# Patient Record
Sex: Male | Born: 1995 | Race: White | Hispanic: No | Marital: Single | Smoking: Never smoker
Health system: Southern US, Community
[De-identification: ages and names within clinical notes are randomized; demographics above are authoritative.]

## PROBLEM LIST (undated history)

## (undated) DIAGNOSIS — J45990 Exercise induced bronchospasm: Secondary | ICD-10-CM

---
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
## 2015-06-21 IMAGING — CT CT CERVICAL SPINE W/O CM
3 of 6 series · 11 of 33 positions shown, 13 images · non-contrast
Comparison: None.

CLINICAL DATA: Fell pole vaulting from 15 feet, no loss of
consciousness but had blurred vision and imbalance

EXAM:
CT HEAD WITHOUT CONTRAST
CT CERVICAL SPINE WITHOUT CONTRAST
TECHNIQUE: Multidetector CT imaging of the head and cervical spine was
performed following the standard protocol without intravenous
contrast. Multiplanar CT image reconstructions of the cervical spine
were also generated.

[Series 5: c_spine 2.0 i30s 3 · axial · 0.32mm/px · z∈[+1052,+1160]mm · 3 of 109 slices shown, 4 images]
[im 28/109  soft-tissue]
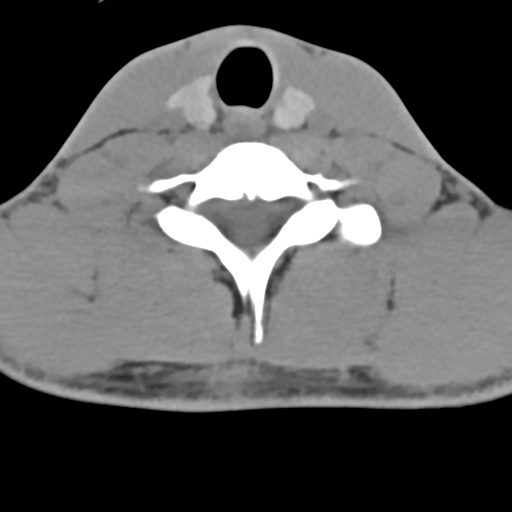
[im 28/109  bone]
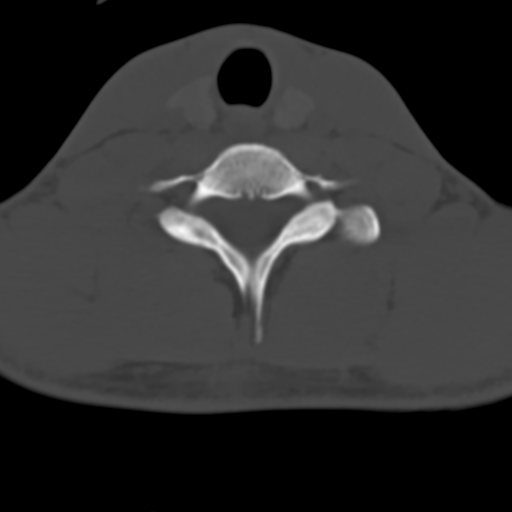
[im 55/109  bone]
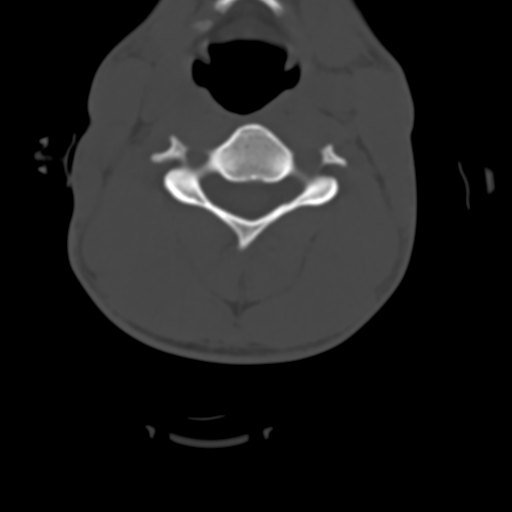
[im 82/109  bone]
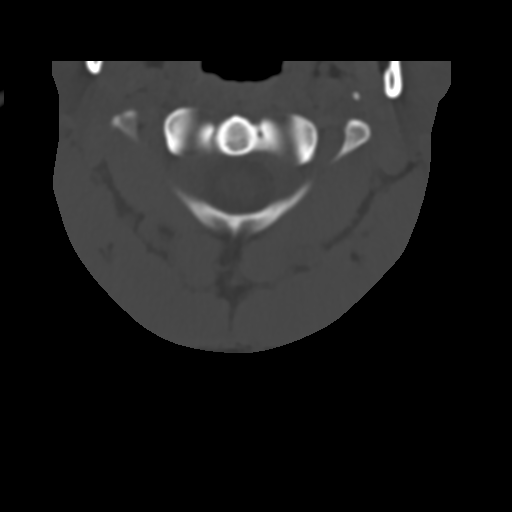

[Series 7: coronals · coronal · 0.23mm/px · 3 of 61 slices shown]
[im 13/61  bone]
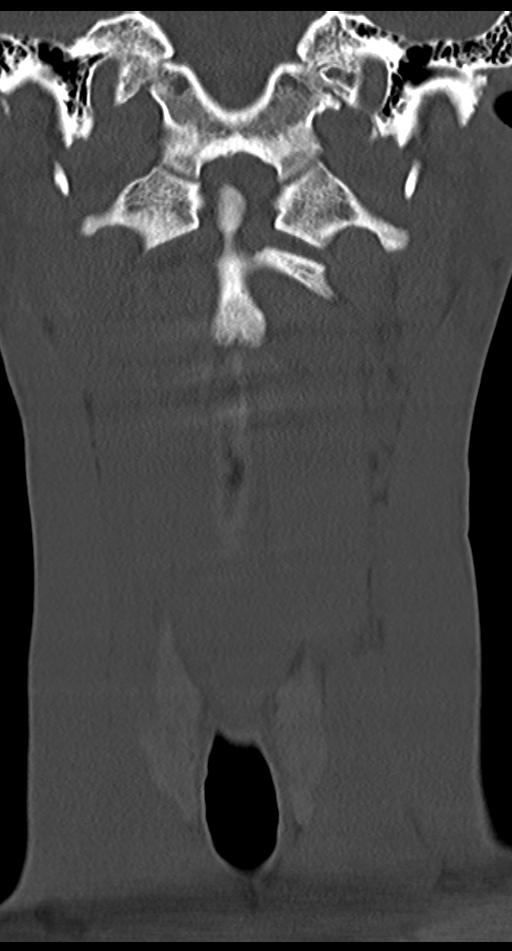
[im 25/61  bone]
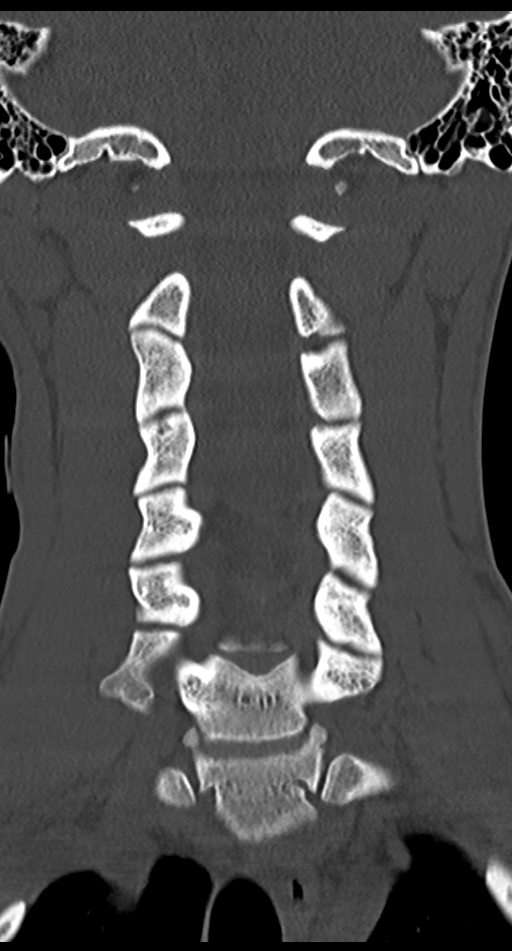
[im 37/61  bone]
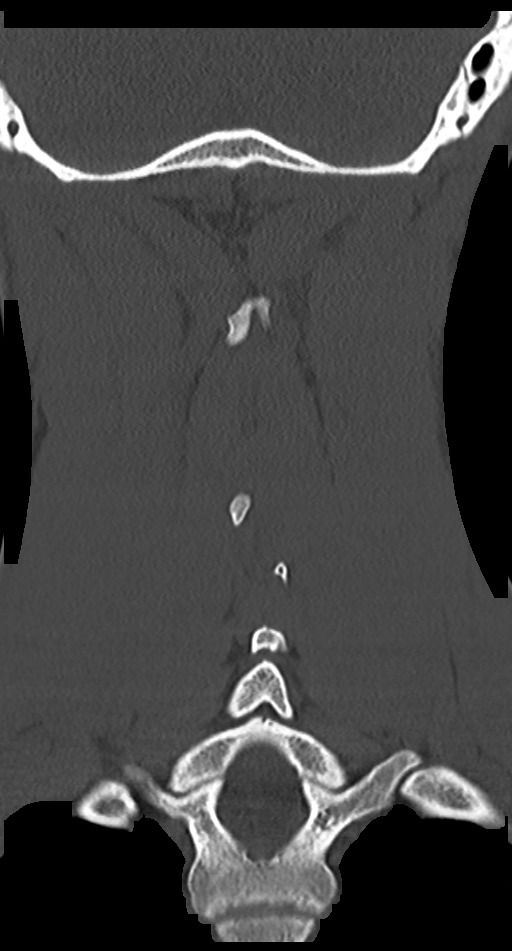

[Series 8: sagittals · sagittal · 0.27mm/px · 5 of 61 slices shown, 6 images]
[im 21/61  bone]
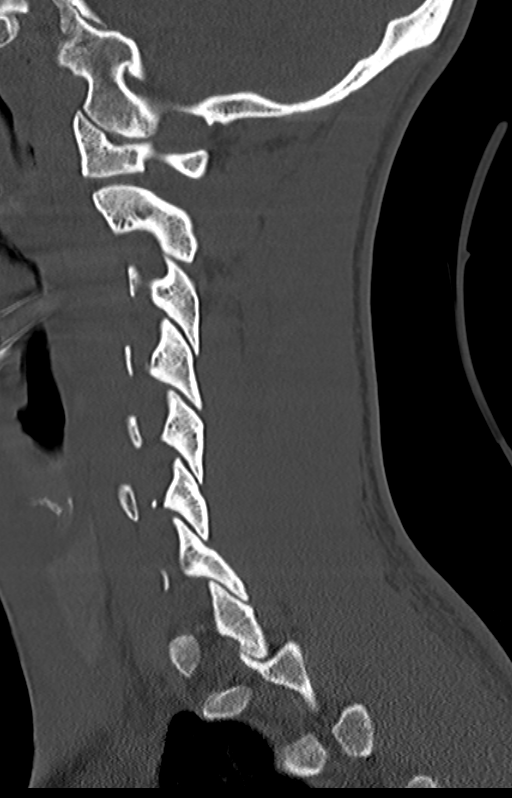
[im 26/61  bone]
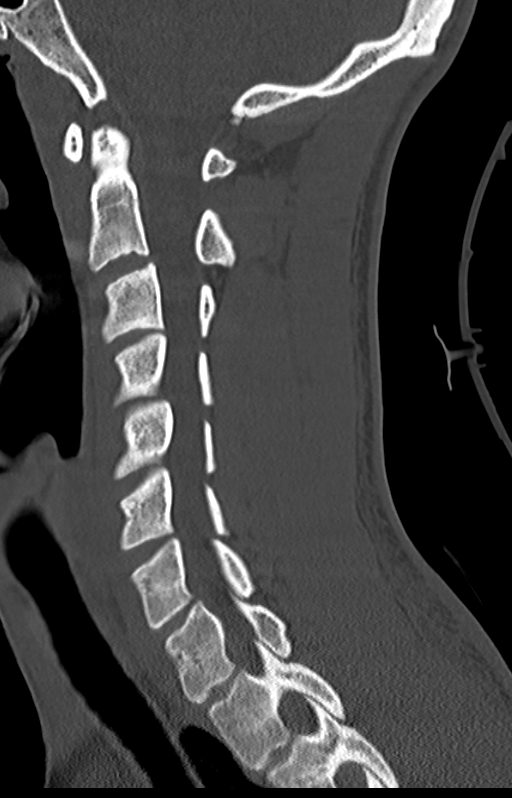
[im 31/61  soft-tissue]
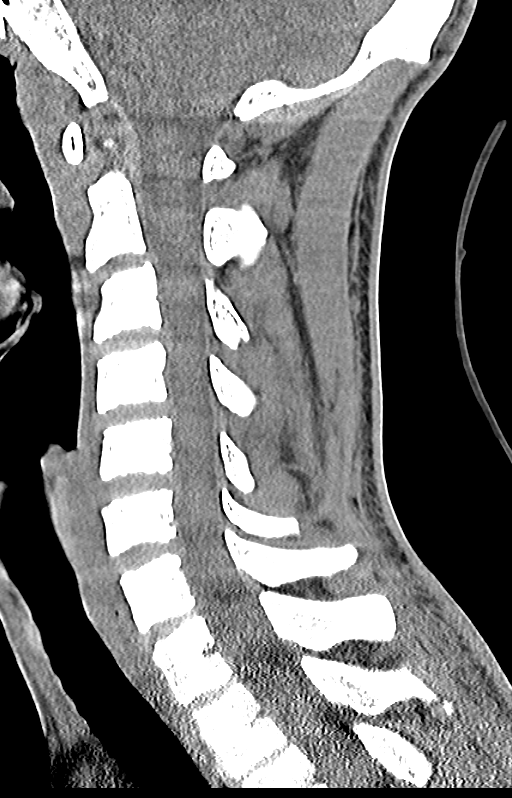
[im 31/61  bone]
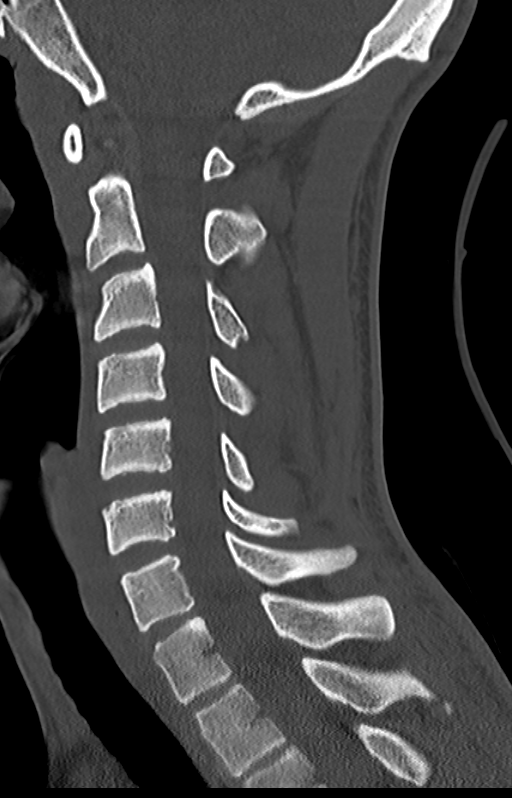
[im 36/61  bone]
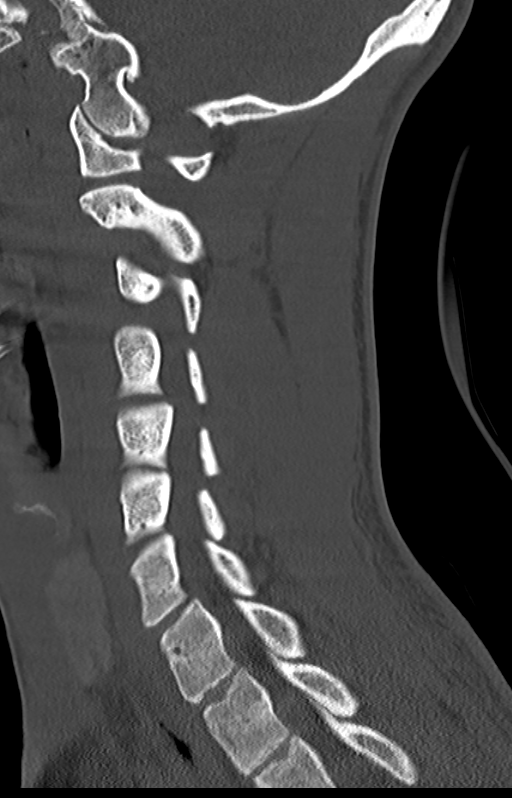
[im 41/61  bone]
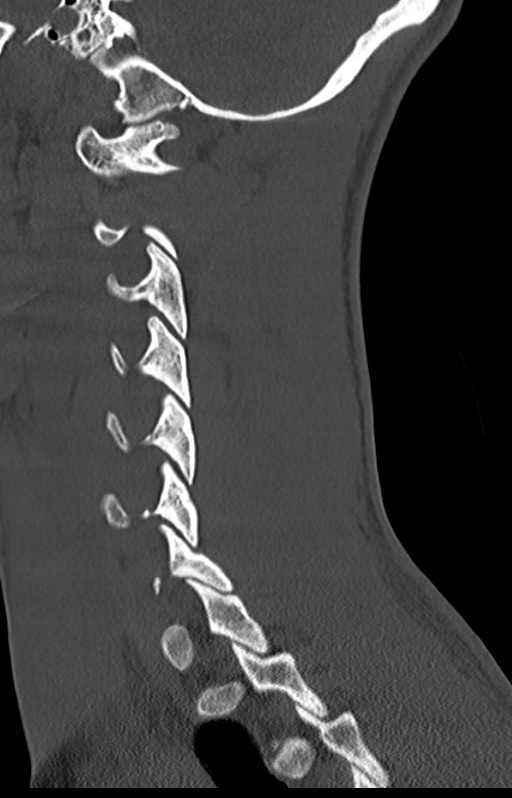

[11 of 33 positions shown; findings below may reference images not displayed]

FINDINGS: CT HEAD FINDINGS

Normal ventricular morphology.

No midline shift or mass effect.

Normal appearance of brain parenchyma.

No intracranial hemorrhage, mass lesion, or acute infarction.

Visualized paranasal sinuses and mastoid air cells clear.

Bones unremarkable.

CT CERVICAL SPINE FINDINGS

Visualized skullbase intact.

Prevertebral soft tissues normal thickness.

Osseous mineralization normal.

Vertebral body and disc space heights maintained.

No acute fracture, subluxation or bone destruction.

Lung apices clear.
IMPRESSION: No acute intracranial abnormalities.

No acute cervical spine abnormalities.
